# Patient Record
Sex: Female | Born: 1995 | Race: White | Hispanic: No | Marital: Single | Smoking: Never smoker
Health system: Southern US, Community
[De-identification: ages and names within clinical notes are randomized; demographics above are authoritative.]

---

## 2019-05-08 ENCOUNTER — Encounter (HOSPITAL_COMMUNITY): Payer: Self-pay

## 2019-05-08 ENCOUNTER — Emergency Department (HOSPITAL_COMMUNITY)
Admission: EM | Admit: 2019-05-08 | Discharge: 2019-05-09 | Disposition: A | Discharge status: Home / Self Care | Payer: 59 | Attending: Emergency Medicine | Admitting: Emergency Medicine

## 2019-05-08 ENCOUNTER — Ambulatory Visit (HOSPITAL_COMMUNITY)
Admission: EM | Admit: 2019-05-08 | Discharge: 2019-05-08 | Disposition: A | Discharge status: Home / Self Care | Payer: 59 | Source: Home / Self Care

## 2019-05-08 ENCOUNTER — Encounter (HOSPITAL_COMMUNITY): Payer: Self-pay | Admitting: Emergency Medicine

## 2019-05-08 ENCOUNTER — Other Ambulatory Visit: Payer: Self-pay

## 2019-05-08 DIAGNOSIS — R630 Anorexia: Secondary | ICD-10-CM | POA: Diagnosis not present

## 2019-05-08 DIAGNOSIS — R198 Other specified symptoms and signs involving the digestive system and abdomen: Secondary | ICD-10-CM

## 2019-05-08 DIAGNOSIS — R1033 Periumbilical pain: Secondary | ICD-10-CM

## 2019-05-08 DIAGNOSIS — R1031 Right lower quadrant pain: Secondary | ICD-10-CM | POA: Diagnosis not present

## 2019-05-08 DIAGNOSIS — R109 Unspecified abdominal pain: Secondary | ICD-10-CM | POA: Diagnosis present

## 2019-05-08 DIAGNOSIS — Z3202 Encounter for pregnancy test, result negative: Secondary | ICD-10-CM | POA: Diagnosis not present

## 2019-05-08 LAB — POCT URINALYSIS DIP (DEVICE)
Bilirubin Urine: NEGATIVE
Glucose, UA: NEGATIVE mg/dL
Hgb urine dipstick: NEGATIVE
Ketones, ur: NEGATIVE mg/dL
Leukocytes,Ua: NEGATIVE
Nitrite: NEGATIVE
Protein, ur: NEGATIVE mg/dL
Specific Gravity, Urine: 1.025 (ref 1.005–1.030)
Urobilinogen, UA: 0.2 mg/dL (ref 0.0–1.0)
pH: 7 (ref 5.0–8.0)

## 2019-05-08 LAB — URINALYSIS, ROUTINE W REFLEX MICROSCOPIC
Bilirubin Urine: NEGATIVE
Glucose, UA: NEGATIVE mg/dL
Hgb urine dipstick: NEGATIVE
Ketones, ur: NEGATIVE mg/dL
Leukocytes,Ua: NEGATIVE
Nitrite: NEGATIVE
Protein, ur: NEGATIVE mg/dL
Specific Gravity, Urine: 1.018 (ref 1.005–1.030)
pH: 5 (ref 5.0–8.0)

## 2019-05-08 LAB — COMPREHENSIVE METABOLIC PANEL
ALT: 34 U/L (ref 0–44)
AST: 24 U/L (ref 15–41)
Albumin: 4.5 g/dL (ref 3.5–5.0)
Alkaline Phosphatase: 47 U/L (ref 38–126)
Anion gap: 9 (ref 5–15)
BUN: 16 mg/dL (ref 6–20)
CO2: 22 mmol/L (ref 22–32)
Calcium: 9.3 mg/dL (ref 8.9–10.3)
Chloride: 107 mmol/L (ref 98–111)
Creatinine, Ser: 0.69 mg/dL (ref 0.44–1.00)
GFR calc Af Amer: >60 mL/min (ref >60)
GFR calc non Af Amer: >60 mL/min (ref >60)
Glucose, Bld: 109 mg/dL — ABNORMAL HIGH (ref 70–99)
Potassium: 3.9 mmol/L (ref 3.5–5.1)
Sodium: 138 mmol/L (ref 135–145)
Total Bilirubin: 0.2 mg/dL — ABNORMAL LOW (ref 0.3–1.2)
Total Protein: 7.3 g/dL (ref 6.5–8.1)

## 2019-05-08 LAB — CBC
HCT: 40 % (ref 36.0–46.0)
Hemoglobin: 13.6 g/dL (ref 12.0–15.0)
MCH: 29.9 pg (ref 26.0–34.0)
MCHC: 34 g/dL (ref 30.0–36.0)
MCV: 87.9 fL (ref 80.0–100.0)
Platelets: 393 10*3/uL (ref 150–400)
RBC: 4.55 MIL/uL (ref 3.87–5.11)
RDW: 12.6 % (ref 11.5–15.5)
WBC: 5.4 10*3/uL (ref 4.0–10.5)
nRBC: 0 % (ref 0.0–0.2)

## 2019-05-08 LAB — I-STAT BETA HCG BLOOD, ED (MC, WL, AP ONLY): I-stat hCG, quantitative: <5 m[IU]/mL (ref <5)

## 2019-05-08 LAB — POCT PREGNANCY, URINE: Preg Test, Ur: NEGATIVE

## 2019-05-08 LAB — POC URINE PREG, ED: Preg Test, Ur: NEGATIVE

## 2019-05-08 LAB — LIPASE, BLOOD: Lipase: 28 U/L (ref 11–51)

## 2019-05-08 MED ORDER — SODIUM CHLORIDE 0.9% FLUSH
3.0000 mL | Freq: Once | INTRAVENOUS | Status: AC
  Administered 2019-05-09: 3 mL via INTRAVENOUS

## 2019-05-08 MED ORDER — IBUPROFEN 800 MG PO TABS
800.0000 mg | ORAL_TABLET | Freq: Once | ORAL | Status: AC
  Administered 2019-05-08: 800 mg via ORAL

## 2019-05-08 MED ORDER — IBUPROFEN 800 MG PO TABS
ORAL_TABLET | ORAL | Status: AC
  Filled 2019-05-08: qty 1

## 2019-05-08 NOTE — ED Triage Notes (Signed)
Pt states she has some pelvis pain x 2 days.

## 2019-05-08 NOTE — Discharge Instructions (Signed)
Please report to the emergency room as I am concerned you are having physical exam findings of appendicitis. You will need a CT scan or an ultrasound to rule this out tonight. This can only be done in the ER at this point.

## 2019-05-08 NOTE — ED Triage Notes (Signed)
"   I started having pain in my belly button that was really bad then pain moved into right lower side". Pt reports decrease appetite, reports increase urination today. LMP 3 weeks ago.

## 2019-05-08 NOTE — ED Provider Notes (Signed)
[facility]   MRN: [id number] DOB: 17-Jul-1995  Subjective:   Gloria Pollard is a 24 y.o. female presenting for 2 day history of peri-umbilical pain that radiates down midline into pelvic area. Sx are moderate, worse with extending her abdomen or movement/activity. LMP was ~3 weeks ago, was regular. Does not have a piercing, no masses noted. Has had decreased appetite. Denies being sexually active, denies concern for STI.   She is not currently taking any medications and has no known food or drug allergies.  Denies past medical and surgical history.  History reviewed. No pertinent family history.  Social History   Tobacco Use  . Smoking status: Never Smoker  . Smokeless tobacco: Never Used  Substance Use Topics  . Alcohol use: Never  . Drug use: Never    Review of Systems  Constitutional: Negative for fever and malaise/fatigue.  HENT: Negative for congestion, ear pain, sinus pain and sore throat.   Eyes: Negative for discharge and redness.  Respiratory: Negative for cough, hemoptysis, shortness of breath and wheezing.   Cardiovascular: Negative for chest pain.  Gastrointestinal: Positive for abdominal pain. Negative for blood in stool, constipation, diarrhea, nausea and vomiting.  Genitourinary: Negative for dysuria, flank pain, frequency, hematuria and urgency.  Musculoskeletal: Negative for myalgias.  Skin: Negative for rash.  Neurological: Negative for dizziness, weakness and headaches.  Psychiatric/Behavioral: Negative for depression and substance abuse.    Objective:   Vitals: BP 130/82 (BP Location: Right Arm)   Pulse 100   Temp 98.4 F (36.9 C) (Oral)   Resp 15   Wt 100 lb (45.4 kg)   LMP 04/10/2019   SpO2 100%   Physical Exam Constitutional:      General: She is not in acute distress.    Appearance: Normal appearance. She is well-developed and normal weight. She is not ill-appearing, toxic-appearing or diaphoretic.  HENT:     Head: Normocephalic  and atraumatic.     Right Ear: External ear normal.     Left Ear: External ear normal.     Nose: Nose normal.     Mouth/Throat:     Mouth: Mucous membranes are moist.     Pharynx: Oropharynx is clear.  Eyes:     General: No scleral icterus.    Extraocular Movements: Extraocular movements intact.     Pupils: Pupils are equal, round, and reactive to light.  Cardiovascular:     Rate and Rhythm: Normal rate and regular rhythm.     Pulses: Normal pulses.     Heart sounds: Normal heart sounds. No murmur. No friction rub. No gallop.   Pulmonary:     Effort: Pulmonary effort is normal. No respiratory distress.     Breath sounds: Normal breath sounds. No stridor. No wheezing, rhonchi or rales.  Abdominal:     General: Bowel sounds are normal. There is no distension.     Palpations: Abdomen is soft. There is no mass.     Tenderness: There is abdominal tenderness in the periumbilical area and suprapubic area. There is guarding. There is no right CVA tenderness, left CVA tenderness or rebound. Positive signs include Rovsing's sign and McBurney's sign. Negative signs include Murphy's sign.     Hernia: No hernia is present.  Skin:    General: Skin is warm and dry.     Coloration: Skin is not pale.     Findings: No rash.  Neurological:     General: No focal deficit present.     Mental Status:  She is alert and oriented to person, place, and time.  Psychiatric:        Mood and Affect: Mood normal.        Behavior: Behavior normal.        Thought Content: Thought content normal.        Judgment: Judgment normal.     Results for orders placed or performed during the hospital encounter of 05/08/19 (from the past 24 hour(s))  POCT urinalysis dip (device)     Status: None   Collection Time: 05/08/19  5:40 PM  Result Value Ref Range   Glucose, UA NEGATIVE NEGATIVE mg/dL   Bilirubin Urine NEGATIVE NEGATIVE   Ketones, ur NEGATIVE NEGATIVE mg/dL   Specific Gravity, Urine 1.025 1.005 - 1.030   Hgb  urine dipstick NEGATIVE NEGATIVE   pH 7.0 5.0 - 8.0   Protein, ur NEGATIVE NEGATIVE mg/dL   Urobilinogen, UA 0.2 0.0 - 1.0 mg/dL   Nitrite NEGATIVE NEGATIVE   Leukocytes,Ua NEGATIVE NEGATIVE  POC urine pregnancy     Status: None   Collection Time: 05/08/19  5:45 PM  Result Value Ref Range   Preg Test, Ur NEGATIVE NEGATIVE  Pregnancy, urine POC     Status: None   Collection Time: 05/08/19  5:45 PM  Result Value Ref Range   Preg Test, Ur NEGATIVE NEGATIVE    Assessment and Plan :   1. RLQ abdominal pain   2. Abdominal guarding   3. Periumbilical pain     Physical exam findings concerning for appendicitis. Discussed possibility of PID, STI, gynecologic sources such as ovarian cyst, uterine fibroids but these are less less likely given physical exam findings.  Redirected patient to the emergency room for CT abdomen pelvis to rule out appendicitis.  Offered patient hydrocodone pain medication but she declined this, will use ibuprofen.  She contracts for safety and will report to the ER now.   Wallis Bamberg, [place] 05/08/19 1814

## 2019-05-09 ENCOUNTER — Emergency Department (HOSPITAL_COMMUNITY): Payer: 59

## 2019-05-09 MED ORDER — IOHEXOL 300 MG/ML  SOLN
100.0000 mL | Freq: Once | INTRAMUSCULAR | Status: AC | PRN
  Administered 2019-05-09: 01:00:00 100 mL via INTRAVENOUS

## 2019-05-09 NOTE — ED Provider Notes (Signed)
[facility]PART[facility] Provider Note   CSN: [id number] Arrival date & time: 05/08/19  1832     History Chief Complaint  Patient presents with  . Abdominal Pain    Gloria Pollard is a 24 y.o. female.  The history is provided by the patient.  Abdominal Pain Pain location:  Periumbilical Pain quality: cramping   Pain radiates to:  RLQ Pain severity:  Moderate Onset quality:  Gradual Duration:  2 days Timing:  Intermittent Progression:  Waxing and waning Chronicity:  New Relieved by:  NSAIDs Worsened by:  Movement and palpation Associated symptoms: anorexia   Associated symptoms: no chest pain, no cough, no dysuria, no fever, no vaginal bleeding, no vaginal discharge and no vomiting   Patient with abdominal pain started over 2 days ago.  It was first periumbilical and now is in the right lower quadrant.  She was seen in urgent care and sent for further evaluation       PMH-none OB History   No obstetric history on file.     No family history on file.  Social History   Tobacco Use  . Smoking status: Never Smoker  . Smokeless tobacco: Never Used  Substance Use Topics  . Alcohol use: Never  . Drug use: Never    Home Medications Prior to Admission medications   Not on File    Allergies    Patient has no known allergies.  Review of Systems   Review of Systems  Constitutional: Negative for fever.  Respiratory: Negative for cough.   Cardiovascular: Negative for chest pain.  Gastrointestinal: Positive for abdominal pain and anorexia. Negative for vomiting.  Genitourinary: Negative for dysuria, vaginal bleeding and vaginal discharge.  All other systems reviewed and are negative.   Physical Exam Updated Vital Signs BP 110/67   Pulse 78   Temp 98.2 F (36.8 C) (Oral)   Resp 18   LMP 04/10/2019   SpO2 100%   Physical Exam CONSTITUTIONAL: Well developed/well nourished HEAD: Normocephalic/atraumatic EYES: EOMI/PERRL NECK:  supple no meningeal signs SPINE/BACK:entire spine nontender CV: S1/S2 noted, no murmurs/rubs/gallops noted LUNGS: Lungs are clear to auscultation bilaterally, no apparent distress ABDOMEN: soft, moderate RLQ tenderness, no rebound or guarding, bowel sounds noted throughout abdomen GU:no cva tenderness NEURO: Pt is awake/alert/appropriate, moves all extremitiesx4.  No facial droop.   EXTREMITIES: pulses normal/equal, full ROM SKIN: warm, color normal PSYCH: no abnormalities of mood noted, alert and oriented to situation ED Results / Procedures / Treatments   Labs (all labs ordered are listed, but only abnormal results are displayed) Labs Reviewed  COMPREHENSIVE METABOLIC PANEL - Abnormal; Notable for the following components:      Result Value   Glucose, Bld 109 (*)    Total Bilirubin 0.2 (*)    All other components within normal limits  URINALYSIS, ROUTINE W REFLEX MICROSCOPIC - Abnormal; Notable for the following components:   APPearance HAZY (*)    All other components within normal limits  LIPASE, BLOOD  CBC  I-STAT BETA HCG BLOOD, ED ([facility], [facility], [facility] ONLY)    EKG None  Radiology CT ABDOMEN PELVIS W CONTRAST  Result Date: 05/09/2019 CLINICAL DATA:  Right lower quadrant pain EXAM: CT ABDOMEN AND PELVIS WITH CONTRAST TECHNIQUE: Multidetector CT imaging of the abdomen and pelvis was performed using the standard protocol following bolus administration of intravenous contrast. CONTRAST:  165mL OMNIPAQUE IOHEXOL 300 MG/ML  SOLN COMPARISON:  None. FINDINGS: Lower chest: Lung bases are clear. No effusions. Heart is  normal size. Hepatobiliary: No focal hepatic abnormality. Gallbladder unremarkable. Pancreas: No focal abnormality or ductal dilatation. Spleen: No focal abnormality.  Normal size. Adrenals/Urinary Tract: No adrenal abnormality. No focal renal abnormality. No stones or hydronephrosis. Urinary bladder is unremarkable. Stomach/Bowel: Moderate stool throughout the colon. No evidence of  bowel obstruction. Normal appendix. Vascular/Lymphatic: No evidence of aneurysm or adenopathy. Reproductive: Uterus and adnexa unremarkable.  No mass. Other: Small amount of free fluid in the pelvis. Musculoskeletal: No acute bony abnormality. IMPRESSION: Normal appendix. Moderate stool in the colon. No acute findings in the abdomen or pelvis. Electronically Signed   By: Charlett Nose M.D.   On: 05/09/2019 01:06    Procedures Procedures  Medications Ordered in ED Medications  sodium chloride flush (NS) 0.9 % injection 3 mL (3 mLs Intravenous Given 05/09/19 0028)    ED Course  I have reviewed the triage vital signs and the nursing notes.  Pertinent labs results that were available during my care of the patient were reviewed by me and considered in my medical decision making (see chart for details).    MDM Rules/Calculators/A&P                      CT scan negative.  Patient feels improved.  She has no concerns for STDs.  Patient to be discharged home.  We discussed strict return precautions Final Clinical Impression(s) / ED Diagnoses Final diagnoses:  Right lower quadrant abdominal pain    Rx / DC Orders ED Discharge Orders    None       Zadie Rhine, MD 05/09/19 [phone]

## 2019-05-09 NOTE — Discharge Instructions (Signed)
°  SEEK IMMEDIATE MEDICAL ATTENTION IF: The pain does not go away or becomes severe, particularly over the next 1-2 days  A temperature above 100.32F develops.  Repeated vomiting occurs (multiple episodes).  Blood is being passed in stools or vomit (bright red or black tarry stools).  Return also if you develop chest pain, difficulty breathing, dizziness or fainting, or become confused, poorly responsive, or inconsolable.

## 2021-02-01 IMAGING — CT CT ABD-PELV W/ CM
2 of 4 series · 17 of 46 positions shown, 19 images · IV contrast (Omni 300)
Comparison: None.

CLINICAL DATA: Right lower quadrant pain

EXAM:
CT ABDOMEN AND PELVIS WITH CONTRAST
TECHNIQUE: Multidetector CT imaging of the abdomen and pelvis was performed
using the standard protocol following bolus administration of
intravenous contrast.
CONTRAST:  100mL OMNIPAQUE IOHEXOL 300 MG/ML  SOLN

[Series 3: a/p w/ 5mm · axial · 0.62mm/px · z∈[+776,+1136]mm · 14 of 80 slices shown, 16 images]
[im 4/80  soft-tissue]
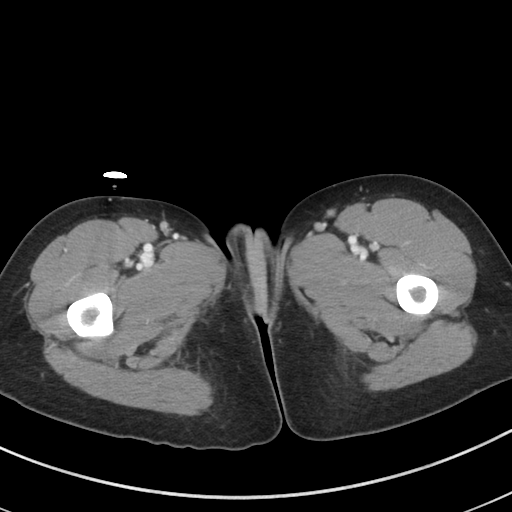
[im 4/80  bone]
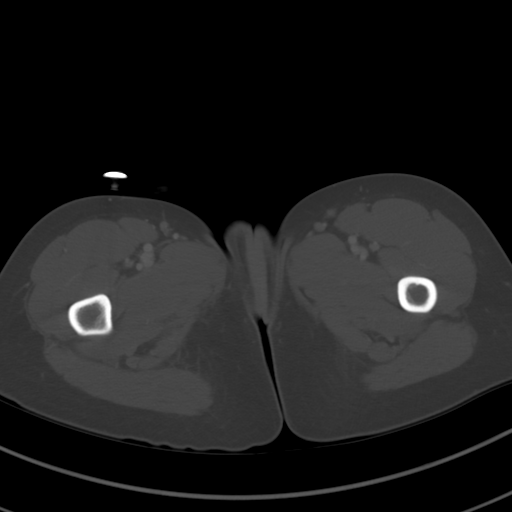
[im 10/80  soft-tissue]
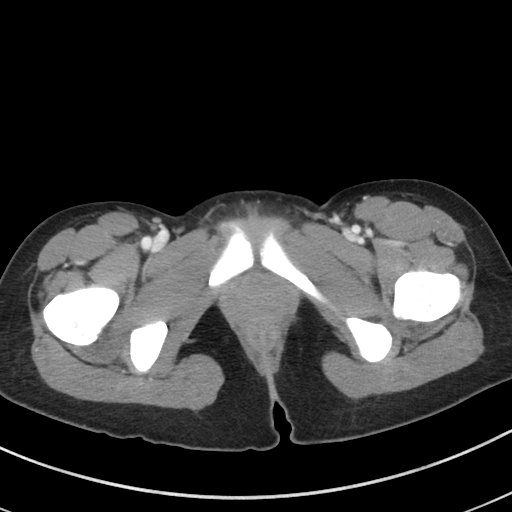
[im 17/80  soft-tissue]
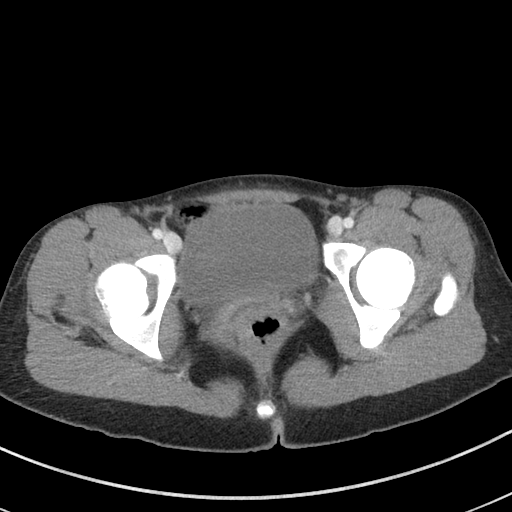
[im 20/80  soft-tissue]
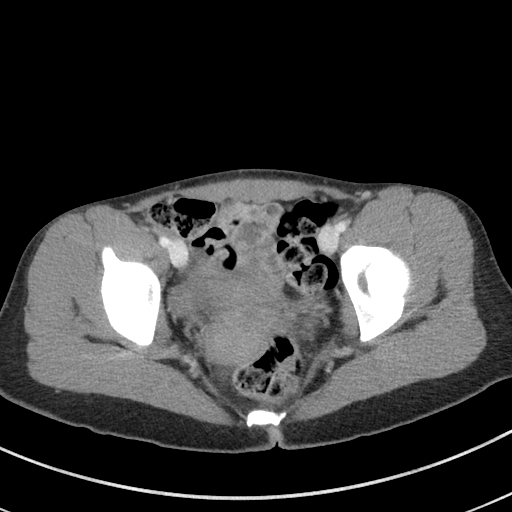
[im 27/80  soft-tissue]
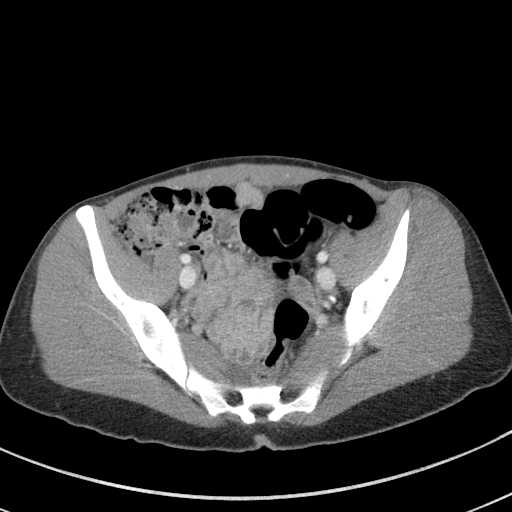
[im 33/80  soft-tissue]
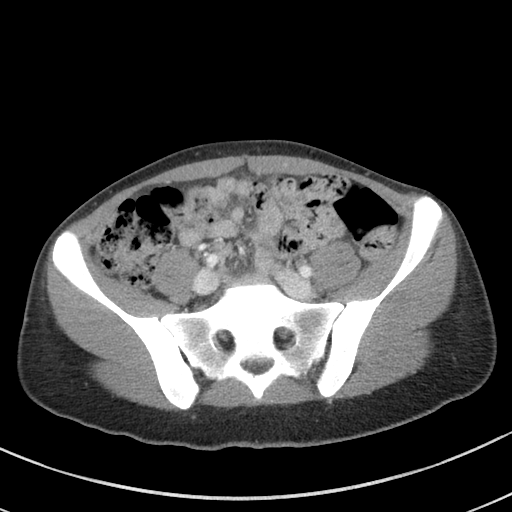
[im 37/80  soft-tissue]
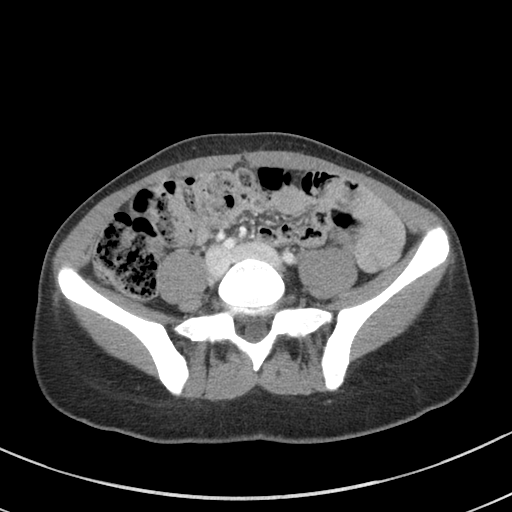
[im 43/80  soft-tissue]
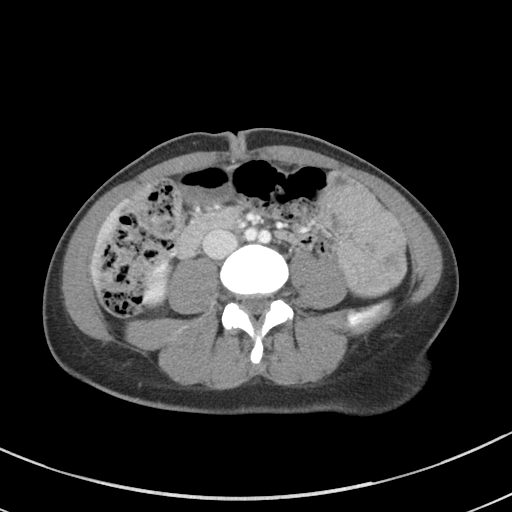
[im 47/80  soft-tissue]
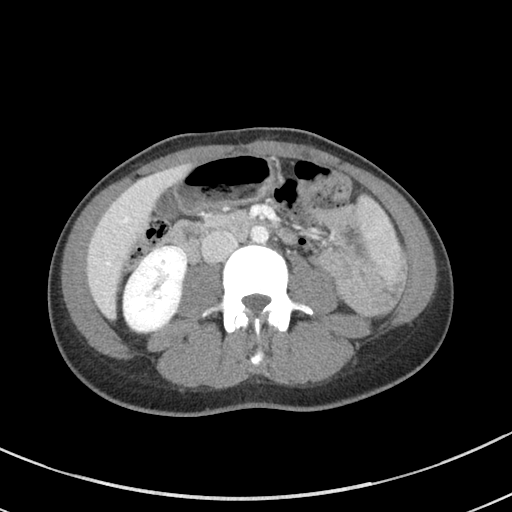
[im 47/80  bone]
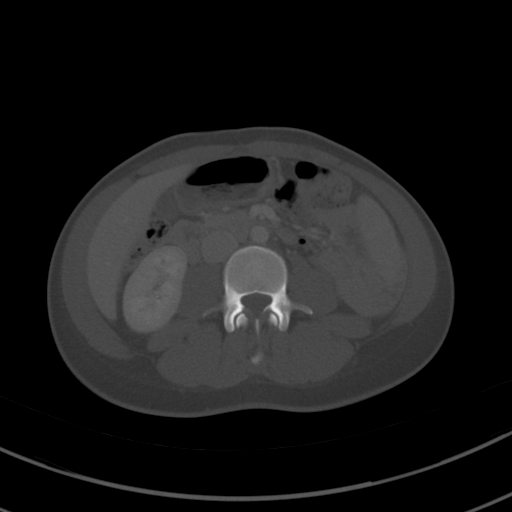
[im 53/80  soft-tissue]
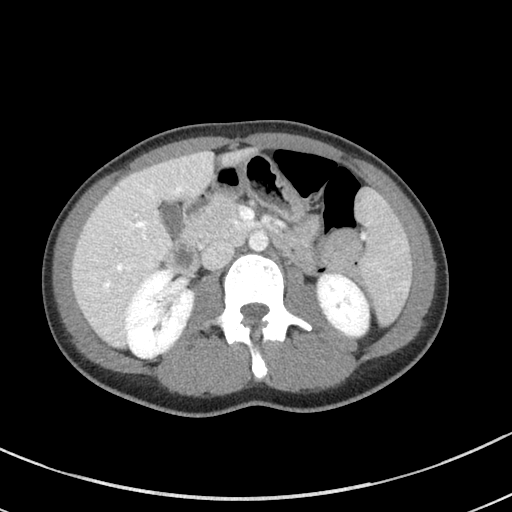
[im 60/80  soft-tissue]
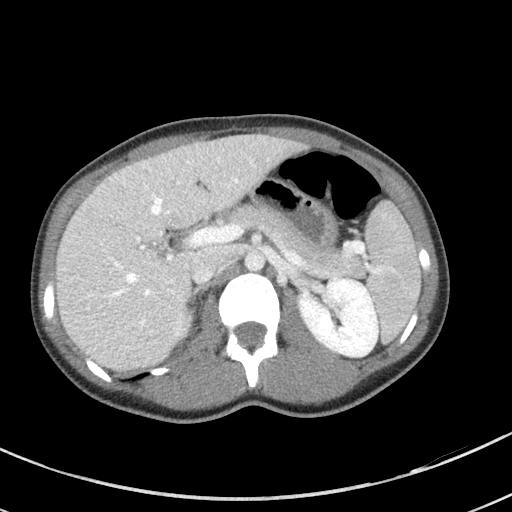
[im 63/80  soft-tissue]
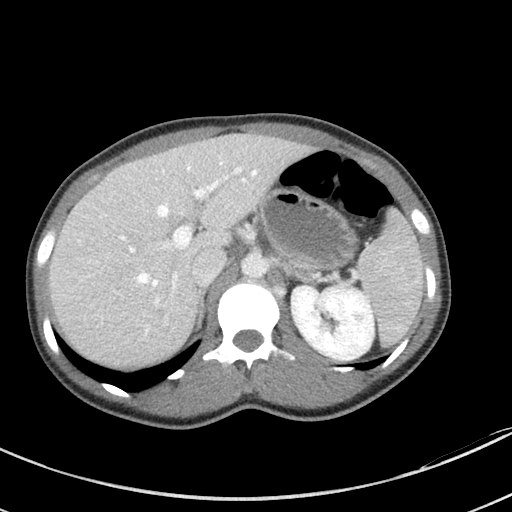
[im 70/80  soft-tissue]
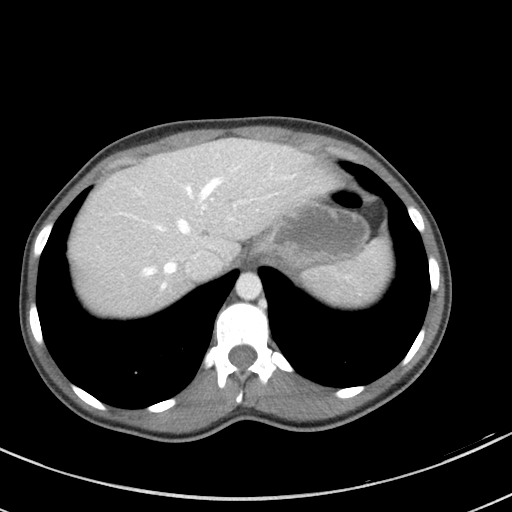
[im 76/80  soft-tissue]
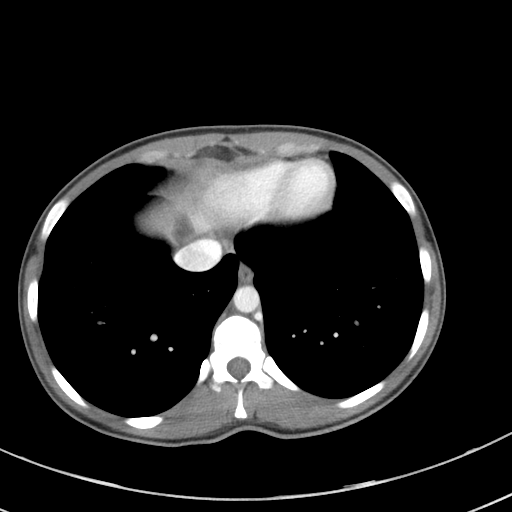

[Series 6: a/p w/ cor · coronal · 0.64mm/px · 3 of 102 slices shown]
[im 34/102  soft-tissue]
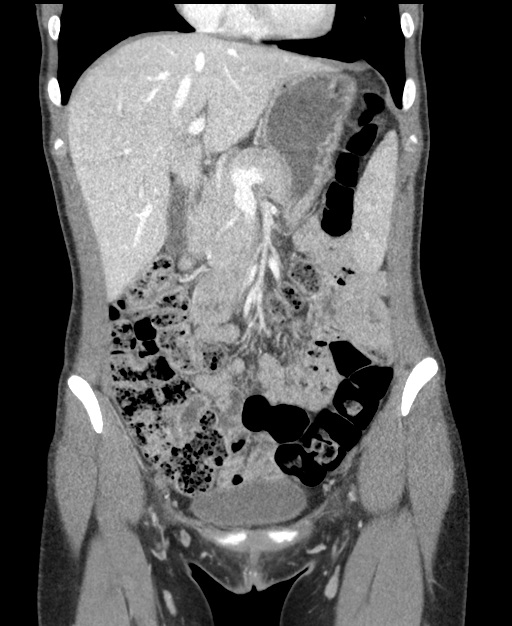
[im 45/102  soft-tissue]
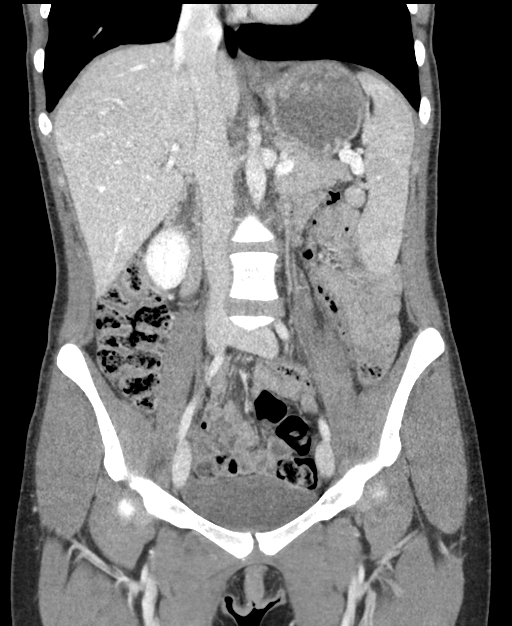
[im 57/102  soft-tissue]
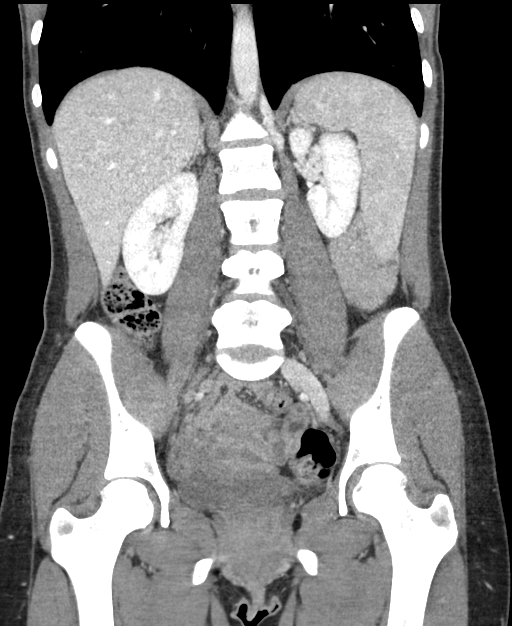

[17 of 46 positions shown; findings below may reference images not displayed]

FINDINGS: Lower chest: Lung bases are clear. No effusions. Heart is normal
size.

Hepatobiliary: No focal hepatic abnormality. Gallbladder
unremarkable.

Pancreas: No focal abnormality or ductal dilatation.

Spleen: No focal abnormality.  Normal size.

Adrenals/Urinary Tract: No adrenal abnormality. No focal renal
abnormality. No stones or hydronephrosis. Urinary bladder is
unremarkable.

Stomach/Bowel: Moderate stool throughout the colon. No evidence of
bowel obstruction. Normal appendix.

Vascular/Lymphatic: No evidence of aneurysm or adenopathy.

Reproductive: Uterus and adnexa unremarkable.  No mass.

Other: Small amount of free fluid in the pelvis.

Musculoskeletal: No acute bony abnormality.
IMPRESSION: Normal appendix.

Moderate stool in the colon.

No acute findings in the abdomen or pelvis.
